# Patient Record
Sex: Female | Born: 1980 | Race: White | Hispanic: No | Smoking: Never smoker
Health system: Southern US, Community
[De-identification: ages and names within clinical notes are randomized; demographics above are authoritative.]

## PROBLEM LIST (undated history)

## (undated) DIAGNOSIS — D649 Anemia, unspecified: Secondary | ICD-10-CM

## (undated) DIAGNOSIS — M419 Scoliosis, unspecified: Secondary | ICD-10-CM

## (undated) HISTORY — DX: Anemia, unspecified: D64.9

## (undated) HISTORY — PX: NO PAST SURGERIES: SHX2092

## (undated) HISTORY — DX: Scoliosis, unspecified: M41.9

---

## 2020-09-05 NOTE — L&D Delivery Note (Signed)
OB/GYN Faculty Practice Delivery Note  Nicole Pierce is a 40 y.o. G6P5005 at [redacted]w[redacted]d admitted for SOL.   GBS Status: Negative  Maximum Maternal Temperature: 98.5  Labor course: Initial SVE: 4 cm. Augmentation with: AROM and Pitocin. She then progressed to complete.  ROM: 9h 67m with clear fluid  Birth: At 1157 a viable female was delivered via spontaneous vaginal delivery (Presentation: OA). Nuchal cord present: Yes.  Shoulders and body delivered in usual fashion. Infant placed directly on mom's abdomen for bonding/skin-to-skin, baby dried and stimulated. Cord clamped x 2 after 1 minute and cut by Spouse/FOB, Hector.  Cord blood collected by RN.  The placenta separated spontaneously and delivered via gentle cord traction.  Pitocin infused rapidly IV per protocol.  Fundus firm with massage.  Placenta inspected and appears to be intact with a 3 VC.  Placenta/Cord with the following complications: none.  Cord pH: n/a Sponge and instrument count were correct x2.  Intrapartum complications:  None Anesthesia:  epidural Episiotomy: none Lacerations:  none Suture Repair:  n/a EBL (mL): 300   Infant: APGAR (1 MIN):  9 APGAR (5 MINS):  9 Infant weight: 3600 gm (7 lbs 15 oz)  Mom to postpartum.  Baby to Couplet care / Skin to Skin. Placenta to L&D   Plans to Breastfeed Contraception: tubal ligation Circumcision: N/A  Note sent to [facility]: KV for pp visit.  Raelyn Mora , MSN, CNM 08/31/2021 12:08 PM

## 2021-07-05 ENCOUNTER — Encounter: Payer: Self-pay | Admitting: *Deleted

## 2021-07-05 DIAGNOSIS — O0930 Supervision of pregnancy with insufficient antenatal care, unspecified trimester: Secondary | ICD-10-CM | POA: Insufficient documentation

## 2021-07-06 ENCOUNTER — Other Ambulatory Visit (HOSPITAL_COMMUNITY)
Admission: RE | Admit: 2021-07-06 | Discharge: 2021-07-06 | Disposition: A | Discharge status: Home / Self Care | Payer: Medicaid Other | Source: Ambulatory Visit | Attending: Advanced Practice Midwife | Admitting: Advanced Practice Midwife

## 2021-07-06 ENCOUNTER — Encounter: Payer: Self-pay | Admitting: Advanced Practice Midwife

## 2021-07-06 ENCOUNTER — Other Ambulatory Visit: Payer: Self-pay

## 2021-07-06 ENCOUNTER — Telehealth: Payer: Self-pay | Admitting: *Deleted

## 2021-07-06 ENCOUNTER — Ambulatory Visit (INDEPENDENT_AMBULATORY_CARE_PROVIDER_SITE_OTHER): Payer: Medicaid Other | Admitting: Advanced Practice Midwife

## 2021-07-06 VITALS — BP 117/71 | HR 91 | Ht 64.0 in | Wt 196.0 lb

## 2021-07-06 DIAGNOSIS — O09523 Supervision of elderly multigravida, third trimester: Secondary | ICD-10-CM | POA: Insufficient documentation

## 2021-07-06 DIAGNOSIS — O0930 Supervision of pregnancy with insufficient antenatal care, unspecified trimester: Secondary | ICD-10-CM | POA: Diagnosis present

## 2021-07-06 DIAGNOSIS — Z3A31 31 weeks gestation of pregnancy: Secondary | ICD-10-CM

## 2021-07-06 DIAGNOSIS — O0933 Supervision of pregnancy with insufficient antenatal care, third trimester: Secondary | ICD-10-CM

## 2021-07-06 DIAGNOSIS — Z3493 Encounter for supervision of normal pregnancy, unspecified, third trimester: Secondary | ICD-10-CM

## 2021-07-06 LAB — OB RESULTS CONSOLE GC/CHLAMYDIA
Gonorrhea: NEGATIVE
Gonorrhea: NEGATIVE

## 2021-07-06 NOTE — Progress Notes (Signed)
Subjective:   Sheron Tallman is a 40 y.o. G6P5005 at [redacted]w[redacted]d by LMP being seen today for her first obstetrical visit.  Her obstetrical history is significant for  vaginal delivery x5   and has Late prenatal care affecting pregnancy on their problem list.. Patient does intend to breast feed. Pregnancy history fully reviewed.  Patient reports no complaints.  HISTORY: OB History  Gravida Para Term Preterm AB Living  6 5 5  0 0 5  SAB IAB Ectopic Multiple Live Births  0 0 0 0 0    # Outcome Date GA Lbr Len/2nd Weight Sex Delivery Anes PTL Lv  6 Current           5 Term      Vag-Spont     4 Term      Vag-Spont     3 Term      Vag-Spont     2 Term      Vag-Spont     1 Term      Vag-Spont      Past Medical History:  Diagnosis Date   Anemia    Scoliosis    History reviewed. No pertinent surgical history. History reviewed. No pertinent family history. Social History   Tobacco Use   Smoking status: Never   Smokeless tobacco: Never  Vaping Use   Vaping Use: Never used  Substance Use Topics   Alcohol use: Not Currently   Drug use: Never   Not on File Current Outpatient Medications on File Prior to Visit  Medication Sig Dispense Refill   ferrous sulfate 325 (65 FE) MG EC tablet Take 325 mg by mouth 3 (three) times daily with meals.     Prenatal Vit-Fe Fumarate-FA (MULTIVITAMIN-PRENATAL) 27-0.8 MG TABS tablet Take 1 tablet by mouth daily at 12 noon.     No current facility-administered medications on file prior to visit.    Exam   Vitals:   07/06/21 1340 07/06/21 1342  BP: 117/71   Pulse: 91   Weight: 196 lb (88.9 kg)   Height:  5\' 4"  (1.626 m)   Fetal Heart Rate (bpm): 143  VS reviewed, nursing note reviewed,  Constitutional: well developed, well nourished, no distress HEENT: normocephalic CV: normal rate Pulm/chest wall: normal effort Abdomen: soft Neuro: alert and oriented x 3 Skin: warm, dry Psych: affect normal     Assessment:   Pregnancy:  13/01/22 Patient Active Problem List   Diagnosis Date Noted   Late prenatal care affecting pregnancy 07/05/2021     Plan:  1. Late prenatal care affecting pregnancy, antepartum --Pt unsure of pregnancy early on then had death in the family and applied for insurance late. Pt with hx uncomplicated pregnancies and reports no problems in this pregnancy. - Culture, OB Urine - [id number] MFM OB DETAIL +14 WK; Future - GC/Chlamydia probe amp ([place])not at [facility]  2. [redacted] weeks gestation of pregnancy   3. Encounter for supervision of low-risk pregnancy in third trimester --Anticipatory guidance about next visits/weeks of pregnancy given. --Needs 2 hour GTT and labs, then prenatal visit in 2 weeks     Initial labs deferred until GTT scheduled Discussed and offered genetic screening options, including Quad screen/AFP, NIPS testing, and option to decline testing. Benefits/risks/alternatives reviewed. Pt aware that anatomy US is form of genetic screening with lower accuracy in detecting trisomies than blood work.  Pt chooses genetic screening today. NIPS: ordered. Ultrasound discussed; fetal anatomic survey: ordered. Problem list reviewed and updated. The nature  of [facility] Faculty Practice with multiple MDs and other Advanced Practice Providers was explained to patient; also emphasized that residents, students are part of our team. Routine obstetric precautions reviewed. Return in about 2 weeks (around 07/20/2021).   Sharen Counter, CNM 07/06/21 2:37 PM

## 2021-07-06 NOTE — Telephone Encounter (Signed)
Left patient an urgent message with MFM appointment information.

## 2021-07-06 NOTE — Progress Notes (Signed)
No prenatal care for this pregnancy yet Pt states last pap 2021- negative

## 2021-07-07 LAB — GC/CHLAMYDIA PROBE AMP (~~LOC~~) NOT AT ARMC
Chlamydia: NEGATIVE
Comment: NEGATIVE
Comment: NORMAL
Neisseria Gonorrhea: NEGATIVE

## 2021-07-08 LAB — CULTURE, OB URINE

## 2021-07-08 LAB — URINE CULTURE, OB REFLEX

## 2021-07-09 ENCOUNTER — Encounter: Payer: Self-pay | Admitting: *Deleted

## 2021-07-09 ENCOUNTER — Other Ambulatory Visit: Payer: Self-pay

## 2021-07-09 ENCOUNTER — Ambulatory Visit: Payer: Medicaid Other | Attending: Advanced Practice Midwife

## 2021-07-09 ENCOUNTER — Other Ambulatory Visit: Payer: Self-pay | Admitting: *Deleted

## 2021-07-09 ENCOUNTER — Ambulatory Visit: Payer: Medicaid Other | Admitting: *Deleted

## 2021-07-09 VITALS — BP 122/70 | HR 89

## 2021-07-09 DIAGNOSIS — O0930 Supervision of pregnancy with insufficient antenatal care, unspecified trimester: Secondary | ICD-10-CM

## 2021-07-09 DIAGNOSIS — O0933 Supervision of pregnancy with insufficient antenatal care, third trimester: Secondary | ICD-10-CM | POA: Diagnosis present

## 2021-07-09 DIAGNOSIS — Z362 Encounter for other antenatal screening follow-up: Secondary | ICD-10-CM

## 2021-07-09 DIAGNOSIS — Z3493 Encounter for supervision of normal pregnancy, unspecified, third trimester: Secondary | ICD-10-CM | POA: Insufficient documentation

## 2021-07-12 ENCOUNTER — Other Ambulatory Visit: Payer: Self-pay

## 2021-07-12 ENCOUNTER — Other Ambulatory Visit (INDEPENDENT_AMBULATORY_CARE_PROVIDER_SITE_OTHER): Payer: Medicaid Other

## 2021-07-12 DIAGNOSIS — Z23 Encounter for immunization: Secondary | ICD-10-CM

## 2021-07-12 DIAGNOSIS — Z3493 Encounter for supervision of normal pregnancy, unspecified, third trimester: Secondary | ICD-10-CM

## 2021-07-12 DIAGNOSIS — Z3A32 32 weeks gestation of pregnancy: Secondary | ICD-10-CM

## 2021-07-12 DIAGNOSIS — Z3483 Encounter for supervision of other normal pregnancy, third trimester: Secondary | ICD-10-CM

## 2021-07-13 LAB — HEMOGLOBIN A1C
Hgb A1c MFr Bld: 4.9 % of total Hgb (ref <5.7)
Mean Plasma Glucose: 94 mg/dL
eAG (mmol/L): 5.2 mmol/L

## 2021-07-13 LAB — OBSTETRIC PANEL
Absolute Monocytes: 485 cells/uL (ref 200–950)
Antibody Screen: NOT DETECTED
Basophils Absolute: 20 cells/uL (ref 0–200)
Basophils Relative: 0.2 %
Eosinophils Absolute: 109 cells/uL (ref 15–500)
Eosinophils Relative: 1.1 %
HCT: 32.6 % — ABNORMAL LOW (ref 35.0–45.0)
Hemoglobin: 11.1 g/dL — ABNORMAL LOW (ref 11.7–15.5)
Hepatitis B Surface Ag: NONREACTIVE
Lymphs Abs: 2515 cells/uL (ref 850–3900)
MCH: 29.8 pg (ref 27.0–33.0)
MCHC: 34 g/dL (ref 32.0–36.0)
MCV: 87.6 fL (ref 80.0–100.0)
MPV: 10.7 fL (ref 7.5–12.5)
Monocytes Relative: 4.9 %
Neutro Abs: 6772 cells/uL (ref 1500–7800)
Neutrophils Relative %: 68.4 %
Platelets: 200 10*3/uL (ref 140–400)
RBC: 3.72 10*6/uL — ABNORMAL LOW (ref 3.80–5.10)
RDW: 12.9 % (ref 11.0–15.0)
RPR Ser Ql: NONREACTIVE
Rubella: 2.52 Index
Total Lymphocyte: 25.4 %
WBC: 9.9 10*3/uL (ref 3.8–10.8)

## 2021-07-13 LAB — 2HR GTT W 1 HR, CARPENTER, 75 G
Glucose, 1 Hr, Gest: 142 mg/dL (ref 65–179)
Glucose, 2 Hr, Gest: 93 mg/dL (ref 65–152)
Glucose, Fasting, Gest: 86 mg/dL (ref 65–91)

## 2021-07-13 LAB — HEPATITIS C ANTIBODY
Hepatitis C Ab: NONREACTIVE
SIGNAL TO CUT-OFF: 0.11 (ref <1.00)

## 2021-07-13 LAB — HIV ANTIBODY (ROUTINE TESTING W REFLEX): HIV 1&2 Ab, 4th Generation: NONREACTIVE

## 2021-07-14 DIAGNOSIS — Z3493 Encounter for supervision of normal pregnancy, unspecified, third trimester: Secondary | ICD-10-CM

## 2021-07-20 ENCOUNTER — Other Ambulatory Visit: Payer: Self-pay

## 2021-07-20 ENCOUNTER — Ambulatory Visit (INDEPENDENT_AMBULATORY_CARE_PROVIDER_SITE_OTHER): Payer: Medicaid Other | Admitting: Advanced Practice Midwife

## 2021-07-20 VITALS — BP 122/68 | HR 99 | Wt 201.0 lb

## 2021-07-20 DIAGNOSIS — O09523 Supervision of elderly multigravida, third trimester: Secondary | ICD-10-CM

## 2021-07-20 DIAGNOSIS — Z3493 Encounter for supervision of normal pregnancy, unspecified, third trimester: Secondary | ICD-10-CM

## 2021-07-20 DIAGNOSIS — Z23 Encounter for immunization: Secondary | ICD-10-CM

## 2021-07-20 DIAGNOSIS — Z3A33 33 weeks gestation of pregnancy: Secondary | ICD-10-CM

## 2021-07-20 NOTE — Progress Notes (Signed)
   PRENATAL VISIT NOTE  Subjective:  Nicole Pierce is a 40 y.o. G6P5005 at [redacted]w[redacted]d being seen today for ongoing prenatal care.  She is currently monitored for the following issues for this low-risk pregnancy and has Late prenatal care affecting pregnancy; Encounter for supervision of low-risk pregnancy in third trimester; and Multigravida of advanced maternal age in third trimester on their problem list.  Patient reports no complaints.  Contractions: Irritability. Vag. Bleeding: None.  Movement: Present. Denies leaking of fluid.   The following portions of the patient's history were reviewed and updated as appropriate: allergies, current medications, past family history, past medical history, past social history, past surgical history and problem list.   Objective:   Vitals:   07/20/21 1351  BP: 122/68  Pulse: 99  Weight: 201 lb (91.2 kg)    Fetal Status: Fetal Heart Rate (bpm): 145   Movement: Present     General:  Alert, oriented and cooperative. Patient is in no acute distress.  Skin: Skin is warm and dry. No rash noted.   Cardiovascular: Normal heart rate noted  Respiratory: Normal respiratory effort, no problems with respiration noted  Abdomen: Soft, gravid, appropriate for gestational age.  Pain/Pressure: Absent     Pelvic: Cervical exam deferred        Extremities: Normal range of motion.  Edema: None  Mental Status: Normal mood and affect. Normal behavior. Normal judgment and thought content.   Assessment and Plan:  Pregnancy: G6P5005 at [redacted]w[redacted]d 1. Encounter for supervision of low-risk pregnancy in third trimester --Anticipatory guidance about next visits/weeks of pregnancy given. --Reviewed anatomy US with EFW 68% --Next visit in 2 weeks   2. [redacted] weeks gestation of pregnancy   3. Multigravida of advanced maternal age in third trimester   Preterm labor symptoms and general obstetric precautions including but not limited to vaginal bleeding, contractions, leaking of  fluid and fetal movement were reviewed in detail with the patient. Please refer to After Visit Summary for other counseling recommendations.   No follow-ups on file.  Future Appointments  Date Time Provider Department Center  08/05/2021  8:30 AM [facility] NURSE [facility]  08/05/2021  8:45 AM [facility] US5 [facility]    Sharen Counter, CNM

## 2021-08-03 ENCOUNTER — Ambulatory Visit (INDEPENDENT_AMBULATORY_CARE_PROVIDER_SITE_OTHER): Payer: Medicaid Other | Admitting: Advanced Practice Midwife

## 2021-08-03 ENCOUNTER — Other Ambulatory Visit: Payer: Self-pay

## 2021-08-03 VITALS — BP 115/75 | HR 99 | Wt 200.0 lb

## 2021-08-03 DIAGNOSIS — Z3A35 35 weeks gestation of pregnancy: Secondary | ICD-10-CM

## 2021-08-03 DIAGNOSIS — Z3493 Encounter for supervision of normal pregnancy, unspecified, third trimester: Secondary | ICD-10-CM

## 2021-08-03 NOTE — Patient Instructions (Signed)
Things to Try After 37 weeks to Encourage Labor/Get Ready for Labor:    Try the Miles Circuit at www.milescircuit.com daily to improve baby's position and encourage the onset of labor.  Walk a little and rest a little every day.  Change positions often.  Cervical Ripening: May try one or both Red Raspberry Leaf capsules or tea:  two 300mg or 400mg tablets with each meal, 2-3 times a day, or 1-3 cups of tea daily  Potential Side Effects Of Raspberry Leaf:  Most women do not experience any side effects from drinking raspberry leaf tea. However, nausea and loose stools are possible   Evening Primrose Oil capsules: take 1 capsule by mouth and place one capsule in the vagina every night.    Some of the potential side effects:  Upset stomach  Loose stools or diarrhea  Headaches  Nausea  Sex can also help the cervix ripen and encourage labor onset.    Labor Precautions Reasons to come to MAU at [facility]:  1.  Contractions are  5 minutes apart or less, each last 1 minute, these have been going on for 1-2 hours, and you cannot walk or talk during them 2.  You have a large gush of fluid, or a trickle of fluid that will not stop and you have to wear a pad 3.  You have bleeding that is bright red, heavier than spotting--like menstrual bleeding (spotting can be normal in early labor or after a check of your cervix) 4.  You do not feel the baby moving like he/she normally does  

## 2021-08-03 NOTE — Progress Notes (Signed)
   PRENATAL VISIT NOTE  Subjective:  Nicole Pierce is a 40 y.o. G6P5005 at [redacted]w[redacted]d being seen today for ongoing prenatal care.  She is currently monitored for the following issues for this low-risk pregnancy and has Late prenatal care affecting pregnancy; Encounter for supervision of low-risk pregnancy in third trimester; and Multigravida of advanced maternal age in third trimester on their problem list.  Patient reports no complaints.  Contractions: Not present. Vag. Bleeding: None.  Movement: Present. Denies leaking of fluid.   The following portions of the patient's history were reviewed and updated as appropriate: allergies, current medications, past family history, past medical history, past social history, past surgical history and problem list.   Objective:   Vitals:   08/03/21 1428  BP: 115/75  Pulse: 99  Weight: 200 lb (90.7 kg)    Fetal Status: Fetal Heart Rate (bpm): 143   Movement: Present     General:  Alert, oriented and cooperative. Patient is in no acute distress.  Skin: Skin is warm and dry. No rash noted.   Cardiovascular: Normal heart rate noted  Respiratory: Normal respiratory effort, no problems with respiration noted  Abdomen: Soft, gravid, appropriate for gestational age.  Pain/Pressure: Absent     Pelvic: Cervical exam deferred        Extremities: Normal range of motion.  Edema: None  Mental Status: Normal mood and affect. Normal behavior. Normal judgment and thought content.   Assessment and Plan:  Pregnancy: G6P5005 at [redacted]w[redacted]d 1. Encounter for supervision of low-risk pregnancy in third trimester --Anticipatory guidance about next visits/weeks of pregnancy given. --Next visit in 2 weeks for GBS/GCC  2. [redacted] weeks gestation of pregnancy   Preterm labor symptoms and general obstetric precautions including but not limited to vaginal bleeding, contractions, leaking of fluid and fetal movement were reviewed in detail with the patient. Please refer to After  Visit Summary for other counseling recommendations.   Return in about 2 weeks (around 08/17/2021).  Future Appointments  Date Time Provider Department Center  08/05/2021  8:30 AM [facility] NURSE [facility]  08/05/2021  8:45 AM [facility] US5 [facility]  08/19/2021  8:50 AM Milas Hock, MD [facility]    Sharen Counter, CNM

## 2021-08-05 ENCOUNTER — Encounter: Payer: Self-pay | Admitting: *Deleted

## 2021-08-05 ENCOUNTER — Ambulatory Visit: Payer: Medicaid Other | Admitting: *Deleted

## 2021-08-05 ENCOUNTER — Other Ambulatory Visit: Payer: Self-pay

## 2021-08-05 ENCOUNTER — Ambulatory Visit: Payer: Medicaid Other | Attending: Maternal & Fetal Medicine

## 2021-08-05 VITALS — BP 123/75 | HR 78

## 2021-08-05 DIAGNOSIS — O0933 Supervision of pregnancy with insufficient antenatal care, third trimester: Secondary | ICD-10-CM | POA: Insufficient documentation

## 2021-08-05 DIAGNOSIS — O0943 Supervision of pregnancy with grand multiparity, third trimester: Secondary | ICD-10-CM | POA: Diagnosis not present

## 2021-08-05 DIAGNOSIS — O09523 Supervision of elderly multigravida, third trimester: Secondary | ICD-10-CM

## 2021-08-05 DIAGNOSIS — Z3A35 35 weeks gestation of pregnancy: Secondary | ICD-10-CM | POA: Diagnosis not present

## 2021-08-05 DIAGNOSIS — Z362 Encounter for other antenatal screening follow-up: Secondary | ICD-10-CM | POA: Insufficient documentation

## 2021-08-19 ENCOUNTER — Encounter: Payer: Medicaid Other | Admitting: Obstetrics and Gynecology

## 2021-08-23 ENCOUNTER — Other Ambulatory Visit (HOSPITAL_COMMUNITY)
Admission: RE | Admit: 2021-08-23 | Discharge: 2021-08-23 | Disposition: A | Discharge status: Home / Self Care | Payer: Medicaid Other | Source: Ambulatory Visit | Attending: Obstetrics & Gynecology | Admitting: Obstetrics & Gynecology

## 2021-08-23 ENCOUNTER — Ambulatory Visit (INDEPENDENT_AMBULATORY_CARE_PROVIDER_SITE_OTHER): Payer: Medicaid Other | Admitting: Obstetrics & Gynecology

## 2021-08-23 ENCOUNTER — Other Ambulatory Visit: Payer: Self-pay

## 2021-08-23 VITALS — BP 117/77 | HR 93 | Wt 202.0 lb

## 2021-08-23 DIAGNOSIS — Z3493 Encounter for supervision of normal pregnancy, unspecified, third trimester: Secondary | ICD-10-CM

## 2021-08-23 LAB — OB RESULTS CONSOLE GC/CHLAMYDIA: Gonorrhea: NEGATIVE

## 2021-08-23 NOTE — Progress Notes (Signed)
° °  PRENATAL VISIT NOTE  Subjective:  Nicole Pierce is a 40 y.o. G6P5005 at [redacted]w[redacted]d being seen today for ongoing prenatal care.  She is currently monitored for the following issues for this low-risk pregnancy and has Late prenatal care affecting pregnancy; Encounter for supervision of low-risk pregnancy in third trimester; and Multigravida of advanced maternal age in third trimester on their problem list.  Patient reports  occasional contractions .   .  .   . Denies leaking of fluid.   The following portions of the patient's history were reviewed and updated as appropriate: allergies, current medications, past family history, past medical history, past social history, past surgical history and problem list.   Objective:  There were no vitals filed for this visit.  Fetal Status:           General:  Alert, oriented and cooperative. Patient is in no acute distress.  Skin: Skin is warm and dry. No rash noted.   Cardiovascular: Normal heart rate noted  Respiratory: Normal respiratory effort, no problems with respiration noted  Abdomen: Soft, gravid, appropriate for gestational age.        Pelvic: Cervical exam performed in the presence of a chaperone   loose 2/50/-2 anterior     Extremities: Normal range of motion.     Mental Status: Normal mood and affect. Normal behavior. Normal judgment and thought content.   Assessment and Plan:  Pregnancy: G6P5005 at [redacted]w[redacted]d   Induce at 40 weeks if no labor Cultures today Vertex, fundal height 38  Term labor symptoms and general obstetric precautions including but not limited to vaginal bleeding, contractions, leaking of fluid and fetal movement were reviewed in detail with the patient. Please refer to After Visit Summary for other counseling recommendations.   No follow-ups on file.  No future appointments.  Elsie Lincoln, MD

## 2021-08-25 LAB — CERVICOVAGINAL ANCILLARY ONLY
Chlamydia: NEGATIVE
Comment: NEGATIVE
Comment: NORMAL
Neisseria Gonorrhea: NEGATIVE

## 2021-08-26 LAB — CULTURE, BETA STREP (GROUP B ONLY)
MICRO NUMBER:: 12776780
SPECIMEN QUALITY:: ADEQUATE

## 2021-08-30 ENCOUNTER — Other Ambulatory Visit: Payer: Self-pay

## 2021-08-30 ENCOUNTER — Inpatient Hospital Stay (HOSPITAL_COMMUNITY)
Admission: EM | Admit: 2021-08-30 | Discharge: 2021-09-01 | DRG: 807 | Disposition: A | Discharge status: Home / Self Care | Payer: Medicaid Other | Attending: Family Medicine | Admitting: Family Medicine

## 2021-08-30 ENCOUNTER — Encounter (HOSPITAL_COMMUNITY): Payer: Self-pay | Admitting: Obstetrics & Gynecology

## 2021-08-30 DIAGNOSIS — R030 Elevated blood-pressure reading, without diagnosis of hypertension: Secondary | ICD-10-CM | POA: Diagnosis present

## 2021-08-30 DIAGNOSIS — O479 False labor, unspecified: Secondary | ICD-10-CM

## 2021-08-30 DIAGNOSIS — O0930 Supervision of pregnancy with insufficient antenatal care, unspecified trimester: Secondary | ICD-10-CM

## 2021-08-30 DIAGNOSIS — Z20822 Contact with and (suspected) exposure to covid-19: Secondary | ICD-10-CM | POA: Diagnosis present

## 2021-08-30 DIAGNOSIS — O26893 Other specified pregnancy related conditions, third trimester: Secondary | ICD-10-CM | POA: Diagnosis present

## 2021-08-30 DIAGNOSIS — Z3493 Encounter for supervision of normal pregnancy, unspecified, third trimester: Secondary | ICD-10-CM

## 2021-08-30 DIAGNOSIS — Z3A39 39 weeks gestation of pregnancy: Secondary | ICD-10-CM | POA: Diagnosis not present

## 2021-08-30 NOTE — MAU Note (Signed)
Pt reports contractions q 10-15 minutes, gush of fluid around 1930

## 2021-08-30 NOTE — H&P (Signed)
Nicole Pierce is a 40 y.o. female [phone] with IUP at [redacted]w[redacted]d by LMP/3rd trimester [place] presenting for ? ROM/contractions. She reports positive fetal movement. She denies vaginal bleeding.  Prenatal History: Term SVD X5 PNC at [facility] Pregnancy complications:  Late PNC @ 31 weeks    Past Medical History: Past Medical History:  Diagnosis Date   Anemia    Scoliosis     Past Surgical History: Past Surgical History:  Procedure Laterality Date   NO PAST SURGERIES      Obstetrical History: OB History     Gravida  6   Para  5   Term  5   Preterm      AB      Living  5      SAB      IAB      Ectopic      Multiple      Live Births               Social History: Social History   Socioeconomic History   Marital status: Significant Other    Spouse name: Not on file   Number of children: Not on file   Years of education: Not on file   Highest education level: Not on file  Occupational History   Not on file  Tobacco Use   Smoking status: Never   Smokeless tobacco: Never  Vaping Use   Vaping Use: Never used  Substance and Sexual Activity   Alcohol use: Not Currently   Drug use: Never   Sexual activity: Yes    Birth control/protection: None  Other Topics Concern   Not on file  Social History Narrative   Not on file   Social Determinants of Health   Financial Resource Strain: Not on file  Food Insecurity: Not on file  Transportation Needs: Not on file  Physical Activity: Not on file  Stress: Not on file  Social Connections: Not on file    Family History: History reviewed. No pertinent family history.  Allergies: No Known Allergies  Medications Prior to Admission  Medication Sig Dispense Refill Last Dose   ferrous sulfate 325 (65 FE) MG EC tablet Take 325 mg by mouth 3 (three) times daily with meals.   08/30/2021   Prenatal Vit-Fe Fumarate-FA (MULTIVITAMIN-PRENATAL) 27-0.8 MG TABS tablet Take 1 tablet by mouth daily at 12 noon.    08/30/2021    Review of Systems   Constitutional: Negative for fever and chills Eyes: Negative for visual disturbances Respiratory: Negative for shortness of breath, dyspnea Cardiovascular: Negative for chest pain or palpitations  Gastrointestinal: Negative for vomiting, diarrhea and constipation.  POSITIVE for abdominal pain (contractions) Genitourinary: Negative for dysuria and urgency Musculoskeletal: Negative for back pain, joint pain, myalgias  Neurological: Negative for dizziness and headaches  Blood pressure 136/80, pulse 84, temperature 98 F (36.7 C), resp. rate 18, last menstrual period 11/27/2020, SpO2 98 %. General appearance: alert, cooperative, and no distress Lungs: normal respiratory effort Heart: regular rate and rhythm Abdomen: soft, non-tender; bowel sounds normal Extremities: Homans sign is negative, no sign of DVT DTR's 2+ Presentation: cephalic Fetal monitoring  Baseline: 140 bpm, Variability: Good {> 6 bpm), Accelerations: Reactive, and Decelerations: Absent Uterine activity  2-3 minutes Dilation: 7 Effacement (%): 80 Station: -1 Exam by:: Nicole Smart RN   Prenatal labs: ABO, Rh: B/RH(D) POSITIVE/-- (11/07 0858) Antibody: NO ANTIBODIES DETECTED (11/07 0858) Rubella: 2.52 (11/07 0858) RPR: NON-REACTIVE (11/07 0858)  HBsAg: NON-REACTIVE (11/07 0858)  HIV:  NON-REACTIVE (11/07 9937)   Nursing Staff Provider  Office Location  [place] Dating    Language  English Anatomy US  wnl  Flu Vaccine  07/20/21 Genetic/Carrier Screen  NIPS: Low Risk AFP:    [place]:  TDaP Vaccine   07/12/21 Hgb A1C or  GTT Early  Third trimester normal  COVID Vaccine    LAB RESULTS   Rhogam  NA Blood Type   B positive  Baby Feeding Plan Breast Antibody  neg  Contraception BTL Rubella  immune  Circumcision NA RPR   nonreactive  Pediatrician  [facility] HBsAg   neg  Support Person Hector HCVAb neg  Prenatal Classes  HIV     nonreactive  BTL  Consent 07/06/21 GBS  negative (For PCN allergy, check sensitivities)   VBAC Consent  Pap         DME Rx [ ]  BP cuff [ ]  Weight Scale Waterbirth  [ ]  Class [ ]  Consent [ ]  CNM visit  PHQ9 & GAD7 [x  ] new OB [ x ] 28 weeks  [  ] 36 weeks Induction  [ ]  Orders Entered [ ] Foley Y/N   Prenatal Transfer Tool  Maternal Diabetes: No Genetic Screening: Declined Maternal Ultrasounds/Referrals: Normal Fetal Ultrasounds or other Referrals:  None Maternal Substance Abuse:  No Significant Maternal Medications:  None Significant Maternal Lab Results: Group B Strep negative  No results found for this or any previous visit (from the past 24 hour(s)).  Assessment: Nicole Pierce is a 40 y.o. with an IUP at [redacted]w[redacted]d presenting for active labor  Plan: #Labor: expectant management #Pain:  Per request #FWB Cat 1   08/30/2021, 11:56 PM

## 2021-08-30 NOTE — ED Triage Notes (Signed)
Pt brought for imminent delivery. Pt is [redacted] weeks pregnant and contractions are 15 minutes apart.

## 2021-08-30 NOTE — ED Provider Notes (Signed)
Emergency Medicine Provider OB Triage Evaluation Note  Nicole Pierce is a 40 y.o. female, [id number], at [redacted]w[redacted]d gestation who presents to the emergency department with complaints of water breaking just PTA and contractions since yesterday. Contractions currently every 15 minutes, does not feel the urge to push at this time. Uncomplicated pregnancy thus far, all prior vaginal deliveries.   Review of  Systems  Positive: contractions, water breaking Negative: syncope  Physical Exam  BP (!) 144/95 (BP Location: Right Arm)    Pulse 100    Temp 98 F (36.7 C)    Resp 17    LMP 11/27/2020    SpO2 98%  General: Awake, alert HEENT: Atraumatic  Resp: Normal effort  Cardiac: Normal rate Abd: gravid  MSK: Moves all extremities without difficulty Neuro: Speech clear  Medical Decision Making  Pt evaluated for pregnancy concern and is stable for transfer to MAU. Pt is in agreement with plan for transfer.  10:27 PM Discussed with MAU APP who accepts patient in transfer.  Clinical Impression   1. Uterine contractions        Cherly Anderson, PA-C 08/30/21 2245    Derwood Kaplan, MD 08/30/21 2317

## 2021-08-31 ENCOUNTER — Inpatient Hospital Stay (HOSPITAL_COMMUNITY): Payer: Medicaid Other | Admitting: Anesthesiology

## 2021-08-31 ENCOUNTER — Encounter (HOSPITAL_COMMUNITY): Payer: Self-pay | Admitting: Obstetrics & Gynecology

## 2021-08-31 ENCOUNTER — Other Ambulatory Visit: Payer: Self-pay

## 2021-08-31 ENCOUNTER — Encounter: Payer: Medicaid Other | Admitting: Advanced Practice Midwife

## 2021-08-31 DIAGNOSIS — Z3A39 39 weeks gestation of pregnancy: Secondary | ICD-10-CM

## 2021-08-31 LAB — RESP PANEL BY RT-PCR (FLU A&B, COVID) ARPGX2
Influenza A by PCR: NEGATIVE
Influenza B by PCR: NEGATIVE
SARS Coronavirus 2 by RT PCR: NEGATIVE

## 2021-08-31 LAB — CBC
HCT: 35 % — ABNORMAL LOW (ref 36.0–46.0)
Hemoglobin: 12.2 g/dL (ref 12.0–15.0)
MCH: 30.6 pg (ref 26.0–34.0)
MCHC: 34.9 g/dL (ref 30.0–36.0)
MCV: 87.7 fL (ref 80.0–100.0)
Platelets: 189 10*3/uL (ref 150–400)
RBC: 3.99 MIL/uL (ref 3.87–5.11)
RDW: 14.3 % (ref 11.5–15.5)
WBC: 10.3 10*3/uL (ref 4.0–10.5)
nRBC: 0 % (ref 0.0–0.2)

## 2021-08-31 LAB — RPR: RPR Ser Ql: NONREACTIVE

## 2021-08-31 LAB — TYPE AND SCREEN
ABO/RH(D): B POS
Antibody Screen: NEGATIVE

## 2021-08-31 MED ORDER — PHENYLEPHRINE 40 MCG/ML (10ML) SYRINGE FOR IV PUSH (FOR BLOOD PRESSURE SUPPORT): 80.0000 ug | PREFILLED_SYRINGE | INTRAVENOUS | Status: DC | PRN

## 2021-08-31 MED ORDER — OXYCODONE HCL 5 MG PO TABS: 5.0000 mg | ORAL_TABLET | ORAL | Status: DC | PRN

## 2021-08-31 MED ORDER — OXYCODONE-ACETAMINOPHEN 5-325 MG PO TABS: 2.0000 | ORAL_TABLET | ORAL | Status: DC | PRN

## 2021-08-31 MED ORDER — COCONUT OIL OIL: 1.0000 "application " | TOPICAL_OIL | Status: DC | PRN

## 2021-08-31 MED ORDER — SENNOSIDES-DOCUSATE SODIUM 8.6-50 MG PO TABS
2.0000 | ORAL_TABLET | Freq: Every day | ORAL | Status: DC
  Administered 2021-09-01: 12:00:00 2 via ORAL
  Filled 2021-08-31: qty 2

## 2021-08-31 MED ORDER — WITCH HAZEL-GLYCERIN EX PADS: 1.0000 "application " | MEDICATED_PAD | CUTANEOUS | Status: DC | PRN

## 2021-08-31 MED ORDER — ONDANSETRON HCL 4 MG PO TABS: 4.0000 mg | ORAL_TABLET | ORAL | Status: DC | PRN

## 2021-08-31 MED ORDER — OXYTOCIN-SODIUM CHLORIDE 30-0.9 UT/500ML-% IV SOLN
1.0000 m[IU]/min | INTRAVENOUS | Status: DC
  Administered 2021-08-31: 08:00:00 2 m[IU]/min via INTRAVENOUS

## 2021-08-31 MED ORDER — OXYCODONE-ACETAMINOPHEN 5-325 MG PO TABS: 1.0000 | ORAL_TABLET | ORAL | Status: DC | PRN

## 2021-08-31 MED ORDER — ZOLPIDEM TARTRATE 5 MG PO TABS: 5.0000 mg | ORAL_TABLET | Freq: Every evening | ORAL | Status: DC | PRN

## 2021-08-31 MED ORDER — LACTATED RINGERS IV SOLN: 500.0000 mL | Freq: Once | INTRAVENOUS | Status: DC

## 2021-08-31 MED ORDER — FENTANYL-BUPIVACAINE-NACL 0.5-0.125-0.9 MG/250ML-% EP SOLN
EPIDURAL | Status: AC
  Filled 2021-08-31: qty 250

## 2021-08-31 MED ORDER — ONDANSETRON HCL 4 MG/2ML IJ SOLN: 4.0000 mg | Freq: Four times a day (QID) | INTRAMUSCULAR | Status: DC | PRN

## 2021-08-31 MED ORDER — ACETAMINOPHEN 325 MG PO TABS: 650.0000 mg | ORAL_TABLET | ORAL | Status: DC | PRN

## 2021-08-31 MED ORDER — IBUPROFEN 600 MG PO TABS
600.0000 mg | ORAL_TABLET | Freq: Four times a day (QID) | ORAL | Status: DC
  Administered 2021-08-31 – 2021-09-01 (×4): 600 mg via ORAL
  Filled 2021-08-31 (×4): qty 1

## 2021-08-31 MED ORDER — OXYCODONE HCL 5 MG PO TABS: 10.0000 mg | ORAL_TABLET | ORAL | Status: DC | PRN

## 2021-08-31 MED ORDER — PRENATAL MULTIVITAMIN CH
1.0000 | ORAL_TABLET | Freq: Every day | ORAL | Status: DC
  Administered 2021-09-01: 12:00:00 1 via ORAL
  Filled 2021-08-31: qty 1

## 2021-08-31 MED ORDER — TERBUTALINE SULFATE 1 MG/ML IJ SOLN: 0.2500 mg | Freq: Once | INTRAMUSCULAR | Status: DC | PRN

## 2021-08-31 MED ORDER — DIBUCAINE (PERIANAL) 1 % EX OINT: 1.0000 "application " | TOPICAL_OINTMENT | CUTANEOUS | Status: DC | PRN

## 2021-08-31 MED ORDER — FLEET ENEMA 7-19 GM/118ML RE ENEM: 1.0000 | ENEMA | RECTAL | Status: DC | PRN

## 2021-08-31 MED ORDER — EPHEDRINE 5 MG/ML INJ: 10.0000 mg | INTRAVENOUS | Status: DC | PRN

## 2021-08-31 MED ORDER — SIMETHICONE 80 MG PO CHEW: 80.0000 mg | CHEWABLE_TABLET | ORAL | Status: DC | PRN

## 2021-08-31 MED ORDER — FERROUS SULFATE 325 (65 FE) MG PO TABS
325.0000 mg | ORAL_TABLET | ORAL | Status: DC
  Administered 2021-08-31: 17:00:00 325 mg via ORAL
  Filled 2021-08-31: qty 1

## 2021-08-31 MED ORDER — FENTANYL-BUPIVACAINE-NACL 0.5-0.125-0.9 MG/250ML-% EP SOLN
12.0000 mL/h | EPIDURAL | Status: DC | PRN
  Administered 2021-08-31: 01:00:00 12 mL/h via EPIDURAL
  Filled 2021-08-31: qty 250

## 2021-08-31 MED ORDER — ONDANSETRON HCL 4 MG/2ML IJ SOLN: 4.0000 mg | INTRAMUSCULAR | Status: DC | PRN

## 2021-08-31 MED ORDER — BENZOCAINE-MENTHOL 20-0.5 % EX AERO
1.0000 "application " | INHALATION_SPRAY | CUTANEOUS | Status: DC | PRN
  Filled 2021-08-31: qty 56

## 2021-08-31 MED ORDER — LACTATED RINGERS IV SOLN: 500.0000 mL | INTRAVENOUS | Status: DC | PRN

## 2021-08-31 MED ORDER — ACETAMINOPHEN 325 MG PO TABS
650.0000 mg | ORAL_TABLET | ORAL | Status: DC | PRN
  Administered 2021-09-01: 09:00:00 650 mg via ORAL
  Filled 2021-08-31: qty 2

## 2021-08-31 MED ORDER — HYDROXYZINE HCL 50 MG PO TABS: 50.0000 mg | ORAL_TABLET | Freq: Four times a day (QID) | ORAL | Status: DC | PRN

## 2021-08-31 MED ORDER — LIDOCAINE HCL (PF) 1 % IJ SOLN: 30.0000 mL | INTRAMUSCULAR | Status: DC | PRN

## 2021-08-31 MED ORDER — OXYTOCIN BOLUS FROM INFUSION: 333.0000 mL | Freq: Once | INTRAVENOUS | Status: DC

## 2021-08-31 MED ORDER — TETANUS-DIPHTH-ACELL PERTUSSIS 5-2.5-18.5 LF-MCG/0.5 IM SUSY: 0.5000 mL | PREFILLED_SYRINGE | Freq: Once | INTRAMUSCULAR | Status: DC

## 2021-08-31 MED ORDER — DIPHENHYDRAMINE HCL 25 MG PO CAPS: 25.0000 mg | ORAL_CAPSULE | Freq: Four times a day (QID) | ORAL | Status: DC | PRN

## 2021-08-31 MED ORDER — DIPHENHYDRAMINE HCL 50 MG/ML IJ SOLN: 12.5000 mg | INTRAMUSCULAR | Status: DC | PRN

## 2021-08-31 MED ORDER — PHENYLEPHRINE 40 MCG/ML (10ML) SYRINGE FOR IV PUSH (FOR BLOOD PRESSURE SUPPORT)
PREFILLED_SYRINGE | INTRAVENOUS | Status: AC
  Filled 2021-08-31: qty 10

## 2021-08-31 MED ORDER — LIDOCAINE HCL (PF) 1 % IJ SOLN
INTRAMUSCULAR | Status: DC | PRN
  Administered 2021-08-31: 11 mL via EPIDURAL

## 2021-08-31 MED ORDER — LACTATED RINGERS IV SOLN: INTRAVENOUS | Status: DC

## 2021-08-31 MED ORDER — FENTANYL CITRATE (PF) 100 MCG/2ML IJ SOLN: 50.0000 ug | INTRAMUSCULAR | Status: DC | PRN

## 2021-08-31 MED ORDER — OXYTOCIN-SODIUM CHLORIDE 30-0.9 UT/500ML-% IV SOLN
2.5000 [IU]/h | INTRAVENOUS | Status: DC
  Filled 2021-08-31: qty 500

## 2021-08-31 MED ORDER — SOD CITRATE-CITRIC ACID 500-334 MG/5ML PO SOLN: 30.0000 mL | ORAL | Status: DC | PRN

## 2021-08-31 NOTE — Progress Notes (Signed)
Continue to adjust monitors.

## 2021-08-31 NOTE — Progress Notes (Signed)
Patient Vitals for the past 4 hrs:  BP Temp Temp src Pulse Resp  08/31/21 0730 (!) 133/93 -- -- 90 --  08/31/21 0700 120/63 -- -- 84 18  08/31/21 0632 114/67 -- -- 93 16  08/31/21 0601 121/72 -- -- 78 18  08/31/21 0531 116/66 -- -- 78 16  08/31/21 0501 122/64 -- -- 85 18  08/31/21 0448 -- 98 F (36.7 C) Oral -- --  08/31/21 0431 132/78 -- -- 86 16  08/31/21 0401 (!) 131/97 -- -- (!) 156 18   Comfortable w/epidural.  FHR Cat 1.  Ctx q 3 minutes.  Cx 9/90/-1.  Baby ROP. Will start pitocin.

## 2021-08-31 NOTE — Lactation Note (Signed)
This note was copied from a baby's chart. Lactation Consultation Note  Patient Name: Nicole Pierce [id number] Date: 08/31/2021 Reason for consult: L&D Initial assessment;Term Age:39 hours  LC in to assist with first feeding.  Baby already latched in cradle hold.  LC assisted with a deeper latch to the breast and added pillow support under Mom's elbow.    Mom breastfed with supplementation all of her other 5 babies.  Mom never felt she had enough milk for babies.  Mom reports slight breast changes in early pregnancy.  Breasts are notably wide spaced.   Talked to Mom about pumping after breastfeeding using a DEBP once she moves to [place].  Mom is interested in efforts to support a fuller milk supply.    Mom also has WIC in [place].  Talked about faxing a request to [facility] for a hospital grade DEBP on discharge.  Encouraged keeping baby STS and offering the breast often with cues.  Maternal Data Has patient been taught Hand Expression?: Yes Does the patient have breastfeeding experience prior to this delivery?: Yes How long did the patient breastfeed?: 7 months  Feeding Mother's Current Feeding Choice: Breast Milk and Formula  LATCH Score Latch: Grasps breast easily, tongue down, lips flanged, rhythmical sucking.  Audible Swallowing: Spontaneous and intermittent  Type of Nipple: Everted at rest and after stimulation (large nipple length and diameter)  Comfort (Breast/Nipple): Soft / non-tender  Hold (Positioning): Assistance needed to correctly position infant at breast and maintain latch.  LATCH Score: 9  Interventions Interventions: Breast feeding basics reviewed;Assisted with latch;Skin to skin;Breast massage;Hand express;Support pillows;Adjust position  Discharge [facility] Program: Yes  Consult Status Consult Status: Follow-up from L&D Date: 08/31/21 Follow-up type: In-patient    [name] 08/31/2021, 12:41 PM

## 2021-08-31 NOTE — Progress Notes (Signed)
Comfortable w/epidural.  FHR Cat 1.  Ctx q 3 minutres . Cx 8/90/-2.  AROM w/llight mec fluid.  Anticipate delivery soon.

## 2021-08-31 NOTE — Discharge Summary (Signed)
Postpartum Discharge Summary      Patient Name: Nicole Pierce DOB: March 21, 1981 MRN: [id number]  Date of admission: 08/30/2021 Delivery date:08/31/2021  Delivering provider: Laury Deep  Date of discharge: 09/01/2021  Admitting diagnosis: Indication for care in labor or delivery [O75.9] Intrauterine pregnancy: [redacted]w[redacted]d     Secondary diagnosis:  Principal Problem:   Indication for care in labor or delivery Active Problems:   Late prenatal care affecting pregnancy   Encounter for supervision of low-risk pregnancy in third trimester  Additional problems: Elevated BP in labor, normal post partum    Discharge diagnosis: Term Pregnancy Delivered                                              Post partum procedures: None   Augmentation: AROM Complications: None  Hospital course: Onset of Labor With Vaginal Delivery      40 y.o. yo [id number] at [redacted]w[redacted]d was admitted in Active Labor on 08/30/2021. Patient had an uncomplicated labor course as follows:  Membrane Rupture Time/Date: 2:01 AM ,08/31/2021   Delivery Method:Vaginal, Spontaneous  Episiotomy: None  Lacerations:    Patient had an uncomplicated postpartum course.  She is ambulating, tolerating a regular diet, passing flatus, and urinating well. Patient is discharged home in stable condition on 09/01/21. She had elevated BP towards the end of her labor however her BP were normotensive in post partum period. She was discharged with plan for 1 week BP check  Newborn Data: Birth date:08/31/2021  Birth time:11:57 AM  Gender:Female  Living status:Living  Apgars:9 ,9  Weight:3600 g   Magnesium Sulfate received: No BMZ received: No Rhophylac:N/A MMR:N/A T-DaP:Given prenatally Flu: Yes Transfusion:No  Physical exam  Vitals:   08/31/21 1854 08/31/21 2200 09/01/21 0200 09/01/21 0600  BP: 117/89 122/75 126/76 119/87  Pulse: 83 79 80 71  Resp: $Remo'18 18 18 17  'tSzBz$ Temp: 98.4 F (36.9 C) 98 F (36.7 C) 98.1 F (36.7 C) 98.2 F  (36.8 C)  TempSrc: Oral Oral Oral Oral  SpO2: 98% 95% 98% 97%  Weight:      Height:       General: alert Lochia: appropriate Uterine Fundus: firm Incision: N/A DVT Evaluation: No evidence of DVT seen on physical exam. Labs: Lab Results  Component Value Date   WBC 10.3 08/30/2021   HGB 12.2 08/30/2021   HCT 35.0 (L) 08/30/2021   MCV 87.7 08/30/2021   PLT 189 08/30/2021   No flowsheet data found. Edinburgh Score: No flowsheet data found.   After visit meds:  Allergies as of 09/01/2021   No Known Allergies      Medication List     STOP taking these medications    calcium carbonate 500 MG chewable tablet Commonly known as: TUMS - dosed in mg elemental calcium   ferrous sulfate 325 (65 FE) MG EC tablet       TAKE these medications    acetaminophen 325 MG tablet Commonly known as: Tylenol Take 2 tablets (650 mg total) by mouth every 4 (four) hours as needed (for pain scale < 4).   ibuprofen 600 MG tablet Commonly known as: ADVIL Take 1 tablet (600 mg total) by mouth every 6 (six) hours. What changed:  medication strength how much to take when to take this reasons to take this   multivitamin-prenatal 27-0.8 MG Tabs tablet Take 1 tablet by mouth  daily at 12 noon.   norethindrone 0.35 MG tablet Commonly known as: Ortho Micronor Take 1 tablet (0.35 mg total) by mouth daily.         Discharge home in stable condition Infant Feeding: Breast Infant Disposition:home with mother Discharge instruction: per After Visit Summary and Postpartum booklet. Activity: Advance as tolerated. Pelvic rest for 6 weeks.  Diet: routine diet Future Appointments: Future Appointments  Date Time Provider [place]  09/28/2021  9:30 AM Leftwich-Kirby, Kathie Dike, CNM [facility]    Follow up Visit:  [facility]. Schedule an appointment as soon as possible for a visit in 6 week(s).   Specialty:  Obstetrics and Gynecology Why: postpartum visit Contact information: [place], [place] [phone]                Message sent to [facility] by Laury Deep, CNM on 08/31/21 and message re: BP check sent to [facility] on 09/01/21 by Dr. Cy Blamer Please schedule this patient for a In person postpartum visit in 4 weeks with the following provider: Any provider. Additional Postpartum F/U: 1 week BP check Low risk pregnancy complicated by:  Delivery mode:  Vaginal, Spontaneous  Anticipated Birth Control:  plan for 6 wk interval BTL  Renard Matter, MD, MPH OB Fellow, Faculty Practice

## 2021-08-31 NOTE — Progress Notes (Signed)
Catlin Doria is a 40 y.o. [id number] at [redacted]w[redacted]d by LMP admitted for ? SROM and contractions.  Subjective: Patient feeling more rectal pressure with contractions. Breathing with contractions now. Spouse supportive at bedside.  Objective: BP 140/72    Pulse 90    Temp 98 F (36.7 C) (Oral)    Resp 16    Ht 5\' 4"  (1.626 m)    Wt 91.6 kg    LMP 11/27/2020    SpO2 100%    BMI 34.67 kg/m  I/O last 3 completed shifts: In: -  Out: 700 [Urine:700] No intake/output data recorded.  FHT:  FHR: 125 bpm, variability: moderate,  accelerations:  Present,  decelerations:  Absent UC:   regular, every 3-4 minutes SVE:   Dilation: 10 Effacement (%): 100 Station: -1 Exam by:: [email or web address], CNM  Labs: Lab Results  Component Value Date   WBC 10.3 08/30/2021   HGB 12.2 08/30/2021   HCT 35.0 (L) 08/30/2021   MCV 87.7 08/30/2021   PLT 189 08/30/2021    Assessment / Plan: Spontaneous labor, progressing normally  Labor: Progressing on Pitocin Preeclampsia:   n/a Fetal Wellbeing:  Category I Pain Control:  Epidural I/D:  n/a Anticipated MOD:  NSVD  09/01/2021, CNM 08/31/2021, 9:58 AM

## 2021-08-31 NOTE — Lactation Note (Signed)
This note was copied from a baby's chart. Lactation Consultation Note  Patient Name: Nicole Pierce KGURK'Y Date: 08/31/2021 Reason for consult: Initial assessment;Term Age:39 hours  Mom has a history of low milk supply with her other babies and always needed to supplement.  Mom desires to exclusively breastfeed.    LC in to visit with P6 Mom of term baby.  Mom reports that baby has latched and fed well 3 times since birth.  Baby sleeping swaddled in crib right now.  LC set up DEBP and assisted Mom to pump for first time.  24 flanges appear to be a good fit currently.  Mom expressing colostrum already.  RN to obtain breast milk labels.  Mom encouraged to focus on latching baby to the breast and save EBM for when baby is hungry after a feeding and then ask for help with spoon feeding baby.   FOB taught how to disassemble pump parts, wash, rinse and air dry in separate bin provided.  Faxed WIC referral to [facility].    Plan- 1- Keep baby STS as much as possible (helps stimulate milk supply and encourage feedings) 2- Offer the breast with feeding cues, ask for help latching baby deeply, goal of 8-12 feedings per 24 hrs. 3-Pump both breasts on initiation setting, adding hand expression to stimulate milk supply 4- feed baby EBM by spoon  5-F/U with OP lactation after discharge   Lactation Tools Discussed/Used Tools: Pump;Flanges Flange Size: 24 Breast pump type: Double-Electric Breast Pump Pump Education: Setup, frequency, and cleaning;Milk Storage Reason for Pumping: support milk supply/history of low milk supply Pumping frequency: Encouraged to pump 15 mins after each breastfeeding  Discharge WIC Program: Yes  Consult Status Consult Status: Follow-up Date: 09/01/21 Follow-up type: In-patient    Nicole Pierce 08/31/2021, 4:47 PM

## 2021-08-31 NOTE — Progress Notes (Signed)
Patient continues to move monitors while moving in the bed. Continue to adjust and fetal heart rate is audible in room.

## 2021-08-31 NOTE — Anesthesia Procedure Notes (Signed)
Epidural Patient location during procedure: OB Start time: 08/31/2021 1:18 AM End time: 08/31/2021 1:36 AM  Staffing Anesthesiologist: Lowella Curb, MD Performed: anesthesiologist   Preanesthetic Checklist Completed: patient identified, IV checked, site marked, risks and benefits discussed, surgical consent, monitors and equipment checked, pre-op evaluation and timeout performed  Epidural Patient position: sitting Prep: ChloraPrep Patient monitoring: heart rate, cardiac monitor, continuous pulse ox and blood pressure Approach: midline Location: L2-L3 Injection technique: LOR saline  Needle:  Needle type: Tuohy  Needle gauge: 17 G Needle length: 9 cm Needle insertion depth: 5 cm Catheter type: closed end flexible Catheter size: 20 Guage Catheter at skin depth: 9 cm Test dose: negative  Assessment Events: blood not aspirated, injection not painful, no injection resistance, no paresthesia and negative IV test  Additional Notes Reason for block:procedure for pain

## 2021-08-31 NOTE — Plan of Care (Signed)
complete

## 2021-08-31 NOTE — Anesthesia Preprocedure Evaluation (Signed)
Anesthesia Evaluation  Patient identified by MRN, date of birth, ID band Patient awake    Reviewed: Allergy & Precautions, NPO status , Patient's Chart, lab work & pertinent test resultsPreop documentation limited or incomplete due to emergent nature of procedure.  Airway Mallampati: II  TM Distance: >3 FB Neck ROM: Full    Dental no notable dental hx.    Pulmonary neg pulmonary ROS,    Pulmonary exam normal breath sounds clear to auscultation       Cardiovascular negative cardio ROS Normal cardiovascular exam Rhythm:Regular Rate:Normal     Neuro/Psych negative neurological ROS  negative psych ROS   GI/Hepatic negative GI ROS, Neg liver ROS,   Endo/Other  negative endocrine ROS  Renal/GU negative Renal ROS  negative genitourinary   Musculoskeletal negative musculoskeletal ROS (+)   Abdominal   Peds negative pediatric ROS (+)  Hematology negative hematology ROS (+)   Anesthesia Other Findings   Reproductive/Obstetrics (+) Pregnancy                             Anesthesia Physical Anesthesia Plan  ASA: 2  Anesthesia Plan: Epidural   Post-op Pain Management:    Induction:   PONV Risk Score and Plan:   Airway Management Planned:   Additional Equipment:   Intra-op Plan:   Post-operative Plan:   Informed Consent:   Plan Discussed with:   Anesthesia Plan Comments:         Anesthesia Quick Evaluation

## 2021-09-01 ENCOUNTER — Telehealth: Payer: Self-pay | Admitting: *Deleted

## 2021-09-01 MED ORDER — ACETAMINOPHEN 325 MG PO TABS
650.0000 mg | ORAL_TABLET | ORAL | 0 refills | Status: AC | PRN
Start: 2021-09-01 — End: ?

## 2021-09-01 MED ORDER — IBUPROFEN 600 MG PO TABS: 600.0000 mg | ORAL_TABLET | Freq: Four times a day (QID) | ORAL | 0 refills | Status: AC

## 2021-09-01 MED ORDER — NORETHINDRONE 0.35 MG PO TABS: 1.0000 | ORAL_TABLET | Freq: Every day | ORAL | 1 refills | Status: AC

## 2021-09-01 NOTE — Telephone Encounter (Signed)
Left patient an urgent message to call the office to schedule 1 week BP check and 2-3 week GYN surgery appointment for BTL

## 2021-09-01 NOTE — Clinical Social Work Maternal (Signed)
°CLINICAL SOCIAL WORK MATERNAL/CHILD NOTE ° °Patient Details  °Name: Nicole Pierce °MRN: [id number] °Date of Birth: 08/02/1981 ° °Date:  09/01/2021 ° °Clinical Social Worker Initiating Note:  Canesha Tesfaye, LCSWA Date/Time: Initiated:  09/01/21/1030    ° °Child's Name:  Nicole Pierce  ° °Biological Parents:  Mother, Father (Hector Pierce 08/23/1970)  ° °Need for Interpreter:  None  ° °Reason for Referral:  Late or No Prenatal Care    ° °Address:  [address] °[address] °[place] [address]  °  °Phone number:  [phone] (home)    ° °Additional phone number:  ° °Household Members/Support Persons (HM/SP):   Household Member/Support Person 1, Household Member/Support Person 2, Household Member/Support Person 3 ° ° °HM/SP Name Relationship DOB or Age  °HM/SP -1 Hector Pierce Significant Other 08/23/1970  °HM/SP -2 Selena Pierce Daughter 10/28/2019  °HM/SP -3 Jeremiah Pierce Son 09/27/1997  °HM/SP -4        °HM/SP -5        °HM/SP -6        °HM/SP -7        °HM/SP -8        ° ° °Natural Supports (not living in the home):  Immediate Family, Extended Family  ° °Professional Supports: None  ° °Employment: Unemployed  ° °Type of Work:    ° °Education:  College graduate  ° °Homebound arranged:   ° °Financial Resources:  Medicaid  ° °Other Resources:  WIC  ° °Cultural/Religious Considerations Which May Impact Care:   ° °Strengths:  Ability to meet basic needs  , Pediatrician chosen, Home prepared for child    ° °Psychotropic Medications:        ° °Pediatrician:    [place] (including [place]) ° °Pediatrician List:  ° °[place]    °[place]    °[place]    °[place]    °[place]    °[place] [facility]  ° ° °Pediatrician Fax Number:   ° °Risk Factors/Current Problems:  None  ° °Cognitive State:  Insightful  , Linear Thinking  , Alert  , Goal Oriented    ° °Mood/Affect:  Interested  , Calm  , Relaxed  , Happy    ° °CSW Assessment: CSW  consulted for LPNC. CSW met with MOB to assess. CSW introduced self and role. CSW observed MOB holding infant and FOB bedside. CSW informed MOB of reason for consult and assessed current feelings. MOB reported she is currently doing well. MOB stated there was LPNC due to multiple family deaths and waiting to be approved for Medicaid. MOB denies any additional barriers. CSW informed MOB of the hospital drug screen policy. MOB aware a CPS report will be made if infant test positive for substance. MOB was understanding. ° °MOB denies any mental health history. MOB reported she has a strong support system in immediate and extended family. MOB denies any current SI or HI. MOB receives WIC resources.  ° °CSW provided education regarding the baby blues period versus PPD and provided resources. CSW provided the New Mom Checklist and encouraged MOB to self evaluate and contact a medical professional if symptoms are noted at any time.  ° °CSW provided review of Sudden Infant Death Syndrome (SIDS) precautions. MOB stated she has all infant needs. MOB denies any barriers to infant follow-up care. MOB reported no additional needs at this time.  ° °CSW will continue to follow UDS/CDS and make a CPS report if warranted. CSW identifies no further need   for intervention and no barriers to discharge at this time. ° °CSW Plan/Description:  CSW Will Continue to Monitor Umbilical Cord Tissue Drug Screen Results and Make Report if Warranted, Child Protective Service Report  , Hospital Drug Screen Policy Information, No Further Intervention Required/No Barriers to Discharge, Sudden Infant Death Syndrome (SIDS) Education, Perinatal Mood and Anxiety Disorder (PMADs) Education, Other Information/Referral to Community Resources  ° ° °Fitzgerald Dunne J Tila Millirons, LCSWA °09/01/2021, 11:16 AM °

## 2021-09-01 NOTE — Anesthesia Postprocedure Evaluation (Signed)
Anesthesia Post Note  Patient: Nicole Pierce  Procedure(s) Performed: AN AD HOC LABOR EPIDURAL     Patient location during evaluation: Mother Baby Anesthesia Type: Epidural Level of consciousness: awake, oriented and awake and alert Pain management: pain level controlled Vital Signs Assessment: post-procedure vital signs reviewed and stable Respiratory status: respiratory function stable, spontaneous breathing and nonlabored ventilation Cardiovascular status: stable Postop Assessment: no headache, adequate PO intake, able to ambulate, patient able to bend at knees and no apparent nausea or vomiting Anesthetic complications: no   No notable events documented.  Last Vitals:  Vitals:   09/01/21 0200 09/01/21 0600  BP: 126/76 119/87  Pulse: 80 71  Resp: 18 17  Temp: 36.7 C 36.8 C  SpO2: 98% 97%    Last Pain:  Vitals:   09/01/21 0600  TempSrc: Oral  PainSc: 3    Pain Goal:                   [name]

## 2021-09-01 NOTE — Telephone Encounter (Signed)
-----   Message from Warner Mccreedy, MD sent at 09/01/2021  9:50 AM EST ----- Regarding: Post partum appt Hi A couple of additional appts needed for this patient Additional Postpartum F/U: 1 week BP check and appt in 2-3 weeks with Gyn surgeon (Dr. Para March or anyone else) to discuss interval BTL Thanks! Dr. Ephriam Jenkins

## 2021-09-10 ENCOUNTER — Ambulatory Visit: Payer: Medicaid Other

## 2021-09-10 ENCOUNTER — Telehealth: Payer: Self-pay

## 2021-09-10 NOTE — Telephone Encounter (Signed)
Pt called to cancel BP check appt for today. Pt said she will call back to reschedule. I encouraged pt to take BP from home today and send [place] a message through MyChart. Pt was also encouraged to reschedule appt. Pt expressed understanding.

## 2021-09-14 ENCOUNTER — Telehealth (HOSPITAL_COMMUNITY): Payer: Self-pay | Admitting: *Deleted

## 2021-09-14 NOTE — Telephone Encounter (Signed)
Mom reports feeling good. No concerns about herself at this time. EPDS=2(No hospital score) Mom reports baby is doing well. Feeding, peeing, and pooping without difficulty. Safe sleep reviewed. Mom reports no concerns about baby at present.  Duffy Rhody, RN 09-14-2021 at 11:47am

## 2021-09-20 ENCOUNTER — Telehealth: Payer: Self-pay | Admitting: *Deleted

## 2021-09-20 ENCOUNTER — Encounter: Payer: Self-pay | Admitting: Advanced Practice Midwife

## 2021-09-20 NOTE — Telephone Encounter (Signed)
Pt sent in her BP 117/90.  She was to send it in 10 days ago.  Her next OB appt is Thursday 09/23/21.

## 2021-09-23 ENCOUNTER — Ambulatory Visit: Payer: Medicaid Other | Admitting: Obstetrics & Gynecology

## 2021-09-28 ENCOUNTER — Ambulatory Visit: Payer: Medicaid Other | Admitting: Advanced Practice Midwife

## 2021-10-14 ENCOUNTER — Ambulatory Visit: Payer: Medicaid Other | Admitting: Obstetrics and Gynecology

## 2022-10-22 IMAGING — US US MFM OB DETAIL+14 WK
1 series · 13 of 28 positions shown · non-contrast
Comparison: none

[Series 1: us mfm ob detail+14 wk · 99 acquisitions, 13 frames shown]
[im 4/99]
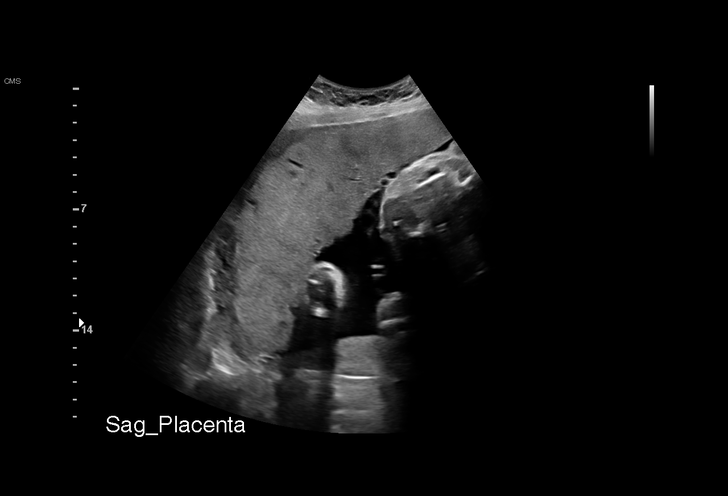
[im 11/99]
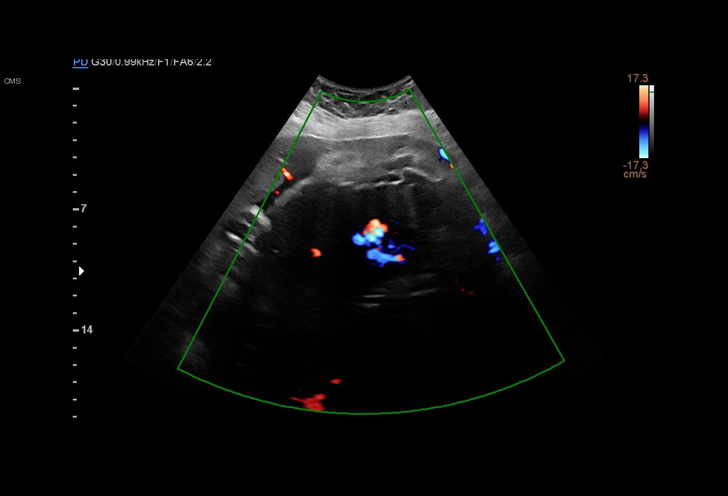
[im 19/99]
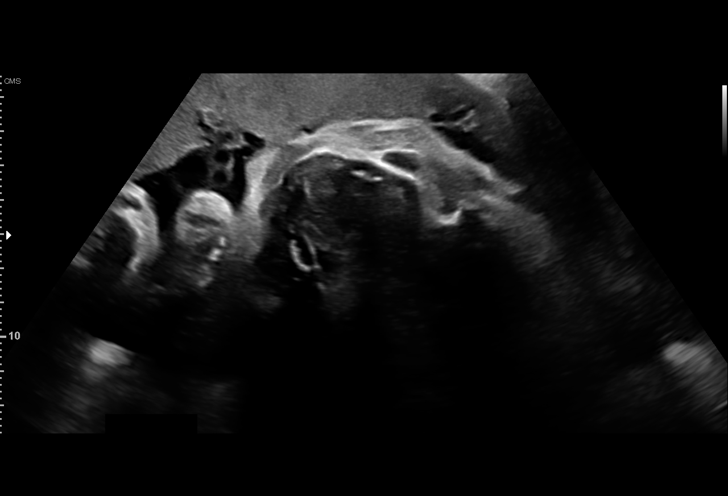
[im 26/99]
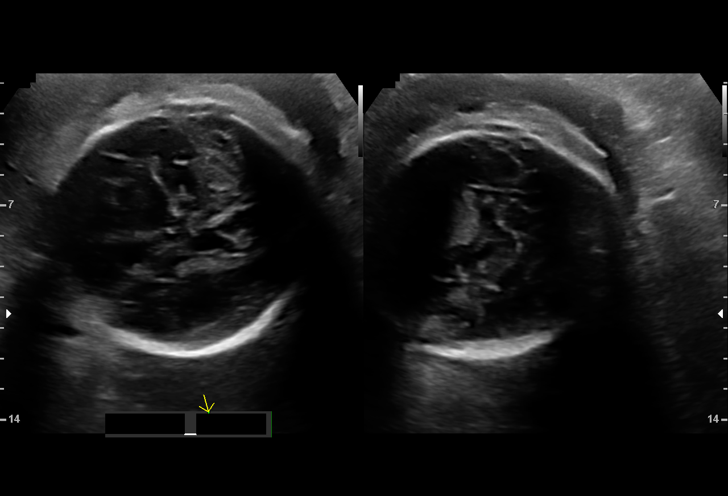
[im 33/99]
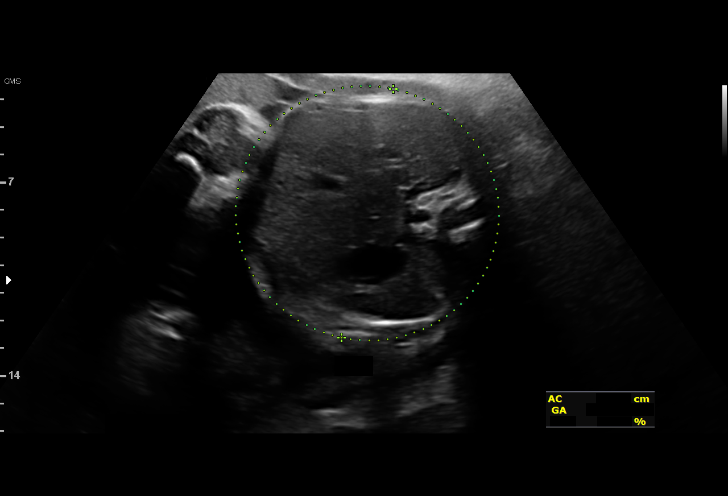
[im 40/99]
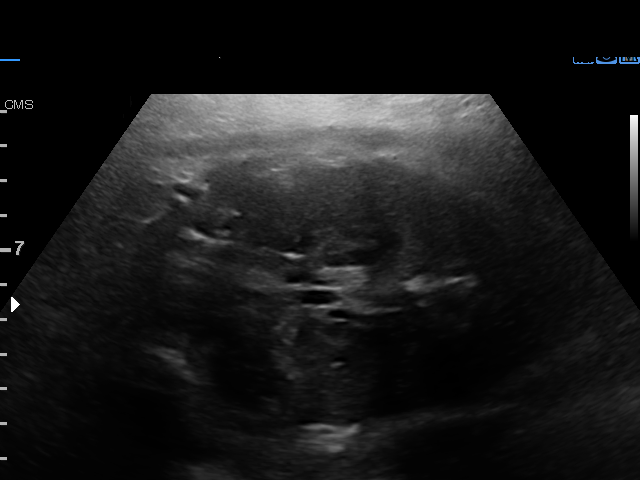
[im 51/99]
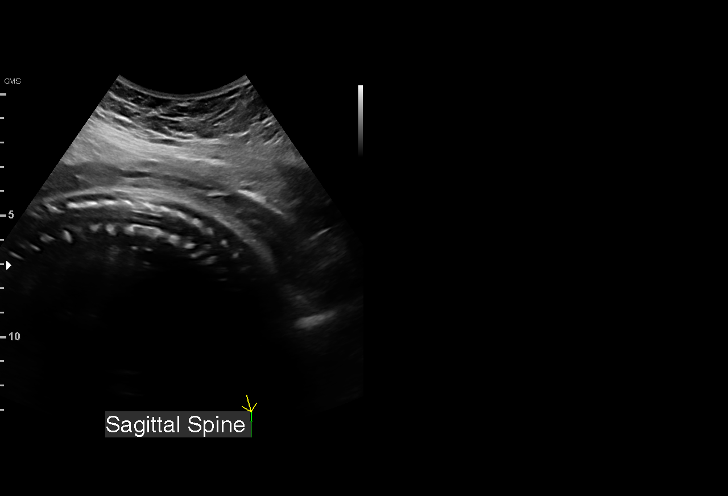
[im 59/99]
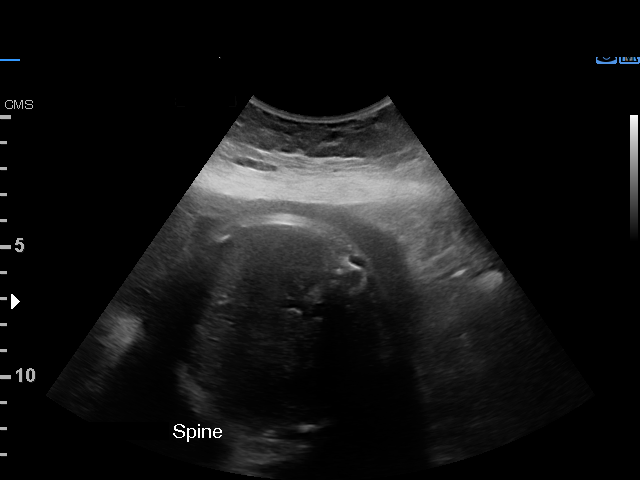
[im 66/99]
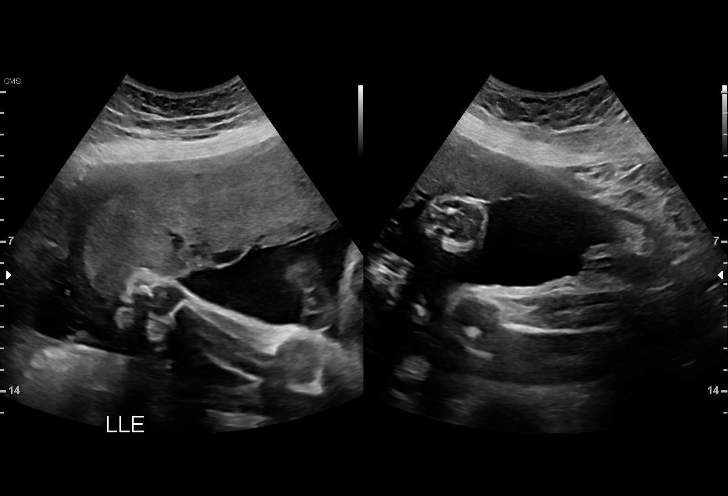
[im 73/99]
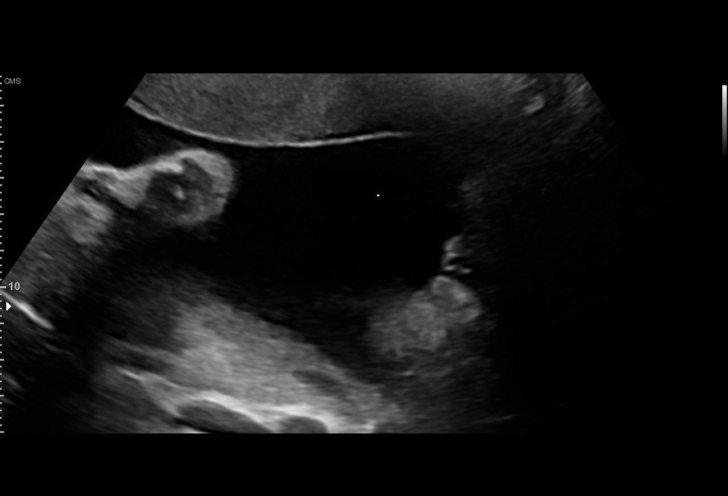
[im 80/99]
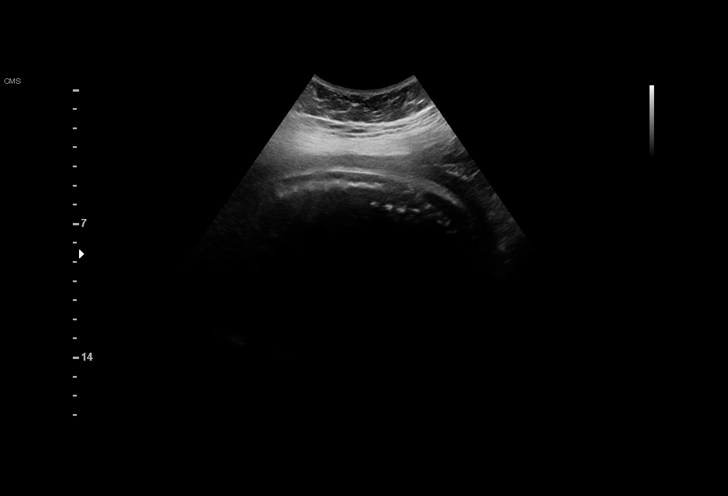
[im 88/99]
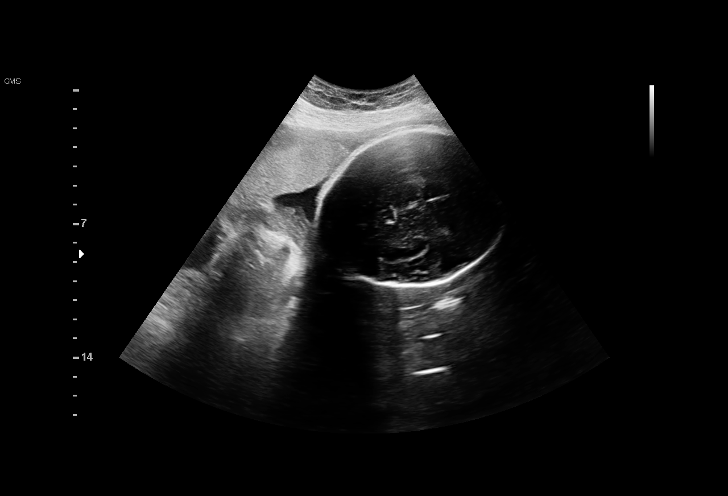
[im 95/99]
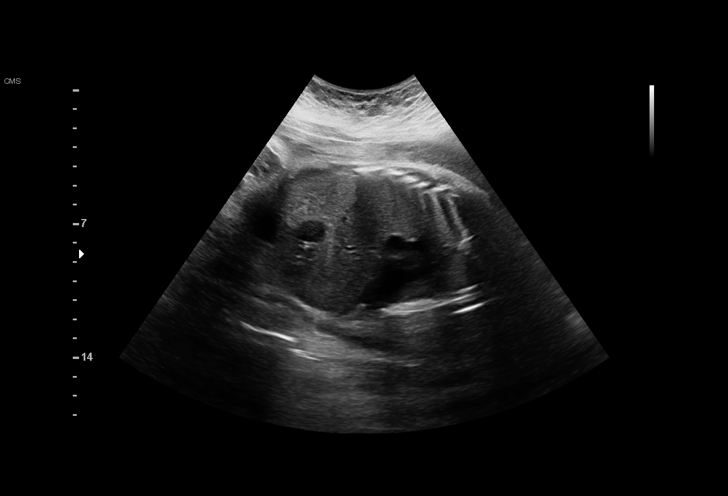

[13 of 28 positions shown; findings below may reference images not displayed]

Indications

 32 weeks gestation of pregnancy
 Antenatal screening for malformations
 Late to prenatal care, third trimester
 (initiated 07/06/21)
 Advanced maternal age multigravida 35+,
 third trimester
Fetal Evaluation

 Num Of Fetuses:         1
 Fetal Heart Rate(bpm):  155
 Cardiac Activity:       Observed
 Presentation:           Cephalic
 Placenta:               Anterior
 P. Cord Insertion:      Not well visualized

 Amniotic Fluid
 AFI FV:      Within normal limits

 AFI Sum(cm)     %Tile       Largest Pocket(cm)
 9.9             15

 RUQ(cm)                     LUQ(cm)        LLQ(cm)

Biometry

 BPD:      80.8  mm     G. Age:  32w 3d         55  %    CI:        73.78   %    70 - 86
                                                         FL/HC:      20.5   %    19.1 -
 HC:      298.8  mm     G. Age:  33w 1d         42  %    HC/AC:      1.01        0.96 -
 AC:      294.9  mm     G. Age:  33w 3d         87  %    FL/BPD:     76.0   %    71 - 87
 FL:       61.4  mm     G. Age:  31w 6d         33  %    FL/AC:      20.8   %    20 - 24
 HUM:        51  mm     G. Age:  29w 6d          5  %

 LV:        4.1  mm

 Est. FW:    2838  gm      4 lb 9 oz     68  %
OB History

 Gravidity:    6         Term:   5        Prem:   0        SAB:   0
 TOP:          0       Ectopic:  0        Living: 5
Gestational Age

 LMP:           32w 0d        Date:  11/27/20                 EDD:   09/03/21
 U/S Today:     32w 5d                                        EDD:   08/29/21
 Best:          32w 0d     Det. By:  LMP  (11/27/20)          EDD:   09/03/21
Anatomy

 Cranium:               Appears normal         LVOT:                   Appears normal
 Cavum:                 Appears normal         Aortic Arch:            Appears normal
 Ventricles:            Appears normal         Ductal Arch:            Not well visualized
 Choroid Plexus:        Appears normal         Diaphragm:              Appears normal
 Cerebellum:            Appears normal         Stomach:                Appears normal, left
                                                                       sided
 Posterior Fossa:       Appears normal         Abdomen:                Appears normal
 Nuchal Fold:           Not applicable (>20    Abdominal Wall:         Not well visualized
                        wks GA)
 Face:                  Appears normal         Cord Vessels:           Appears normal (3
                        (orbits and profile)                           vessel cord)
 Lips:                  Appears normal         Kidneys:                Appear normal
 Palate:                Not well visualized    Bladder:                Appears normal
 Thoracic:              Appears normal         Spine:                  Limited views
                                                                       appear normal
 Heart:                 Not well visualized    Upper Extremities:      Visualized
 RVOT:                  Appears normal         Lower Extremities:      Appears normal
Targeted Anatomy

 Head/Neck
 Nasal Bone:            Present                Maxilla:                Appears normal
 Mandible:              Appears normal

 Thorax
 SVC:                   Appears normal         3 V Trachea View:       Appears normal
 3 Vessel View:         Appears normal         IVC:                    Appears normal

 Other
 Genitalia:             Normal Female
Cervix Uterus Adnexa

 Cervix
 Not visualized (advanced GA >19wks)

 Uterus
 No abnormality visualized.
 Right Ovary
 Not visualized.

 Left Ovary
 Not visualized.

 Cul De Sac
 No free fluid seen.

 Adnexa
 No abnormality visualized.
Impression

 Single intrauterine pregnancy here for a detailed anatomy
 due to elevated BMI and late to prenatal care.
 Normal anatomy with measurements consistent with dates
 There is good fetal movement and amniotic fluid volume
 Suboptimal views of the fetal anatomy were obtained
 secondary to fetal position and advanced gestational age.

 I discussed today's visit and the limitation of today's exam
 due to advanced gestational age.
Recommendations

 Follow up growth in 3-4 weeks.

## 2022-11-18 IMAGING — US US MFM OB FOLLOW-UP
1 series · 13 of 28 positions shown · non-contrast
Comparison: none

[Series 1: us mfm ob follow-up · 60 acquisitions, 13 frames shown]
[im 3/60]
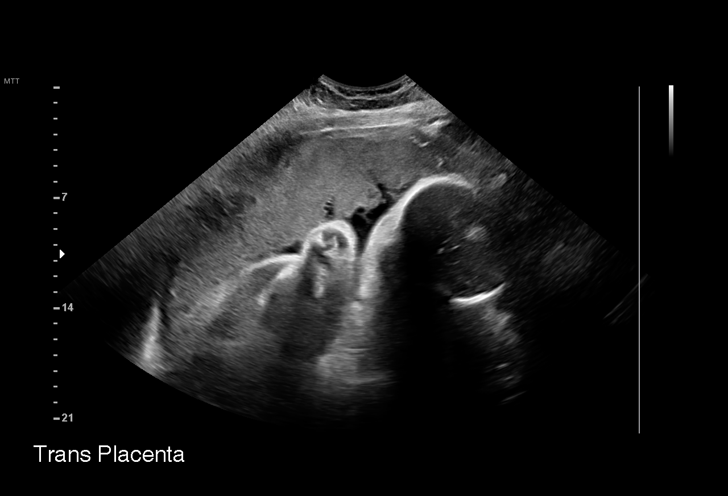
[im 7/60]
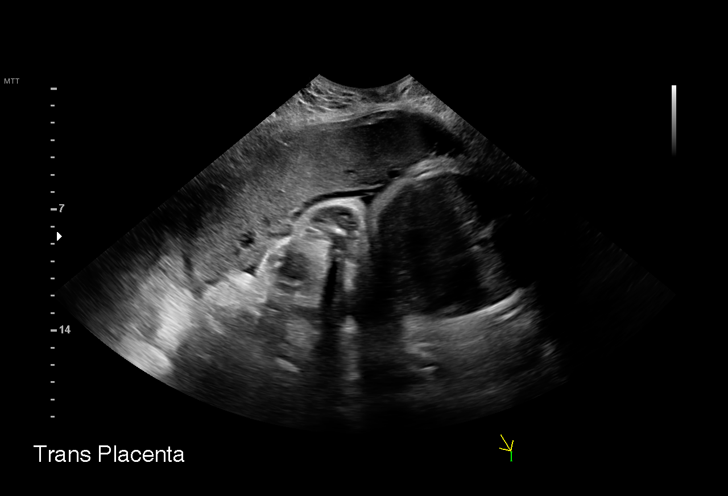
[im 11/60]
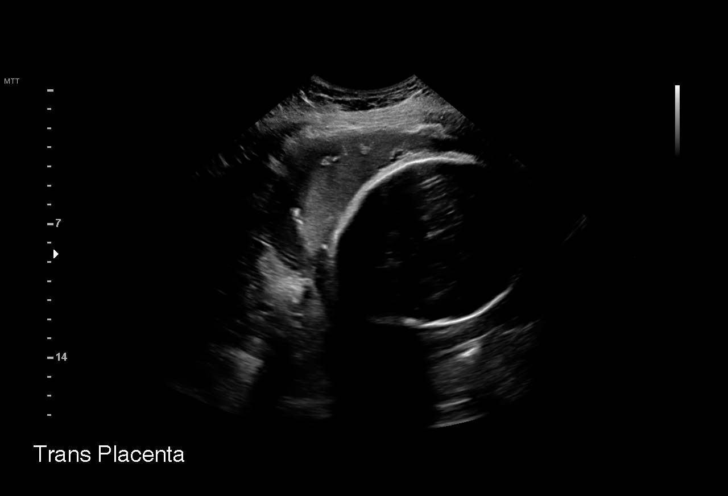
[im 16/60]
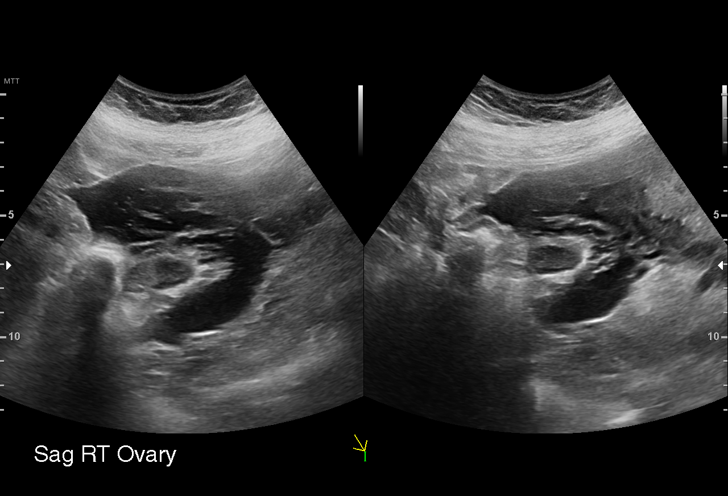
[im 20/60]
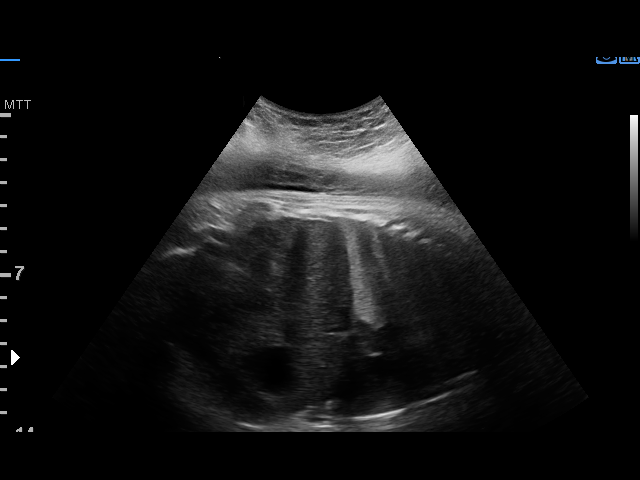
[im 25/60]
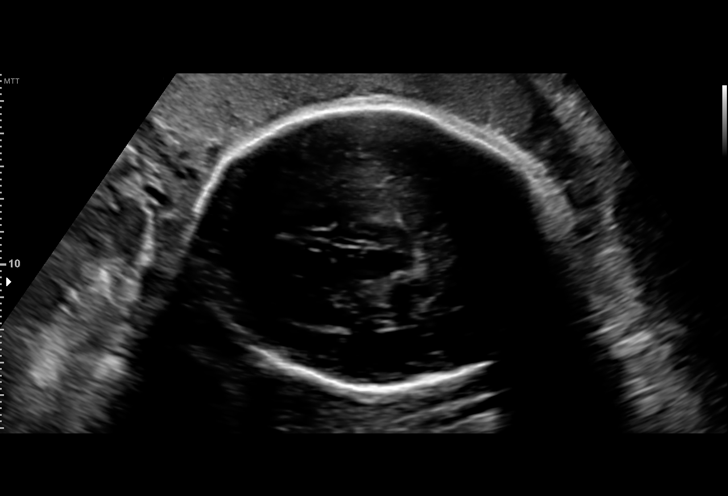
[im 31/60]
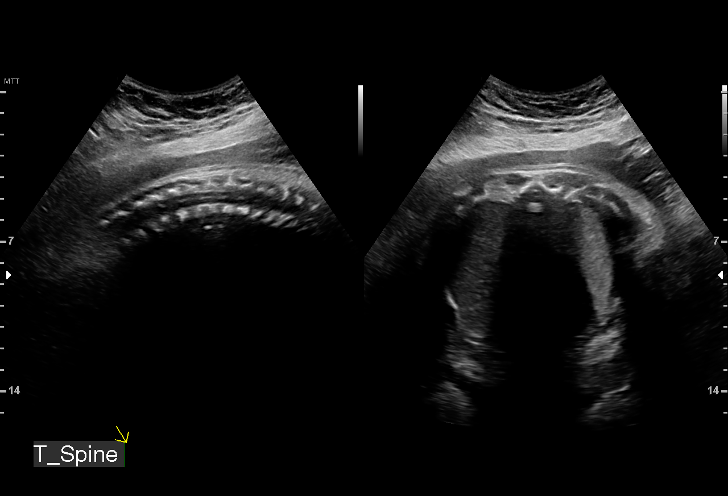
[im 35/60]
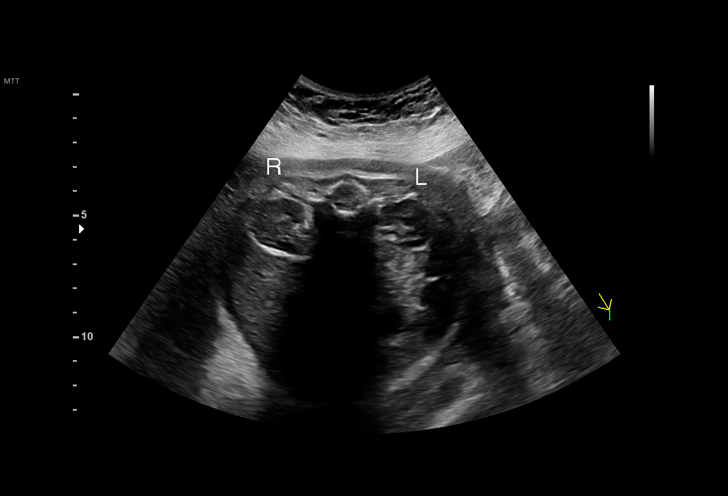
[im 40/60]
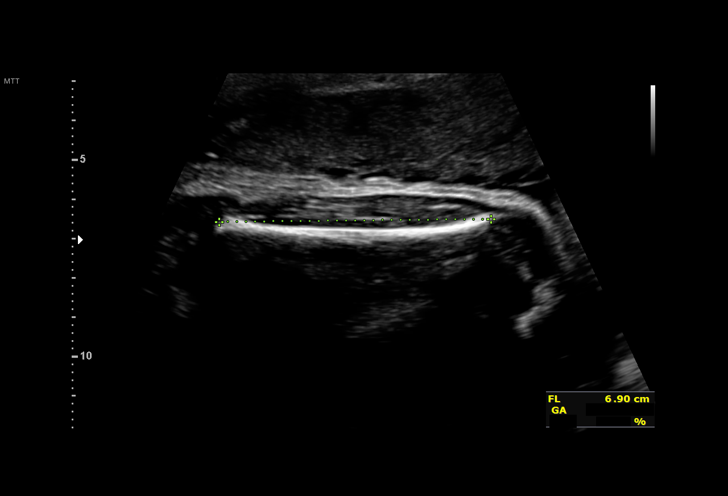
[im 44/60]
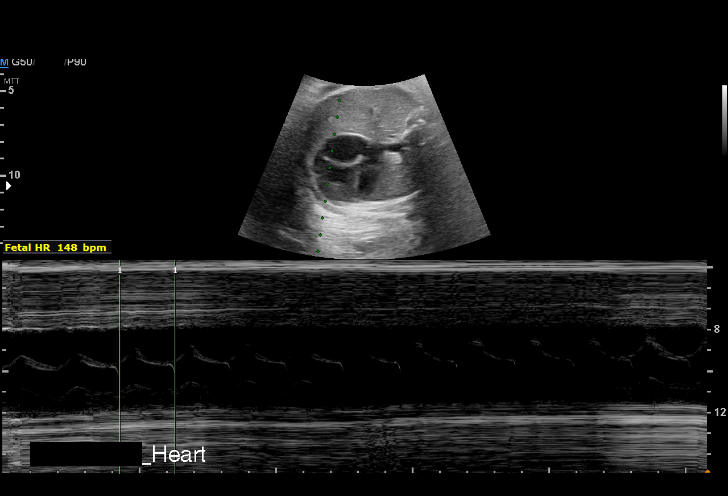
[im 49/60]
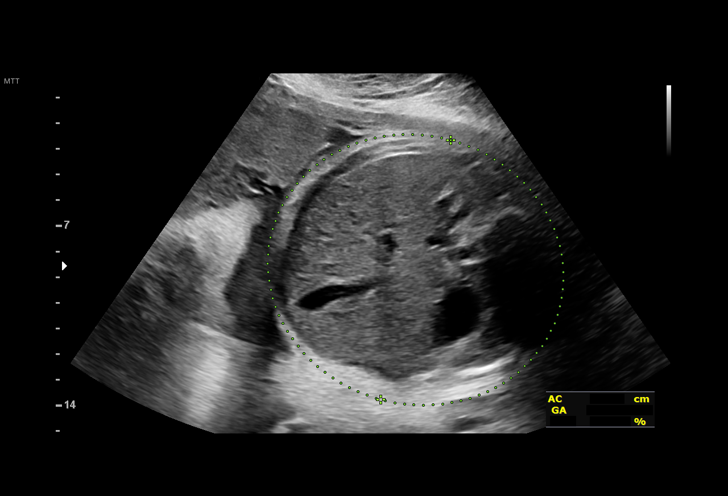
[im 53/60]
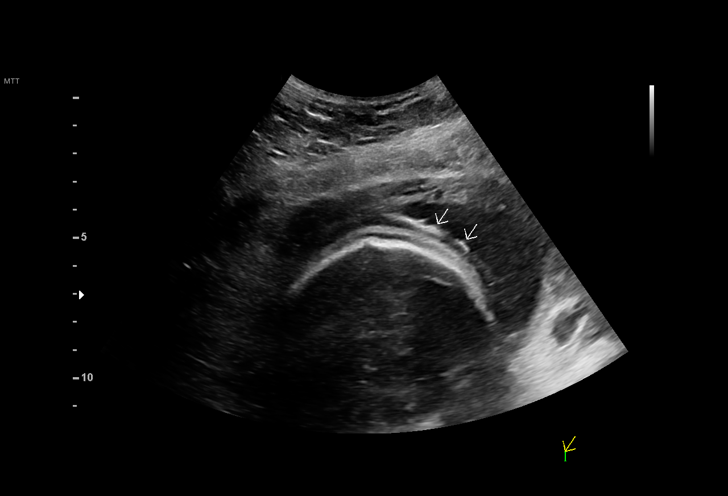
[im 57/60]
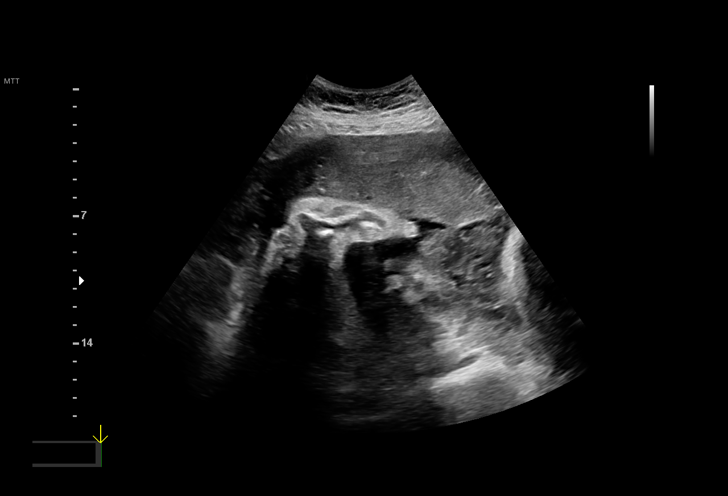

[13 of 28 positions shown; findings below may reference images not displayed]

DANII

Indications

 Late to prenatal care, third trimester
 (initiated 07/06/21)
 Advanced maternal age multigravida 35+,
 third trimester
 35 weeks gestation of pregnancy
 Encounter for other antenatal screening
 follow-up
 Short interval between pregancies, 3rd
 trimester
 Grand multiparity, antepartum
 LR NIPS
 Obesity complicating pregnancy, third
 trimester
Fetal Evaluation

 Num Of Fetuses:         1
 Fetal Heart Rate(bpm):  148
 Cardiac Activity:       Observed
 Presentation:           Cephalic
 Placenta:               Anterior
 P. Cord Insertion:      Not well visualized

 Amniotic Fluid
 AFI FV:      Within normal limits

 AFI Sum(cm)     %Tile       Largest Pocket(cm)
 14.1            51          5

 RUQ(cm)       RLQ(cm)       LUQ(cm)        LLQ(cm)
 5
Biometry

 BPD:        87  mm     G. Age:  35w 1d         36  %    CI:        75.38   %    70 - 86
                                                         FL/HC:      21.6   %    20.1 -
 HC:      317.8  mm     G. Age:  35w 5d         18  %    HC/AC:      0.92        0.93 -
 AC:      346.6  mm     G. Age:  38w 4d         99  %    FL/BPD:     79.0   %    71 - 87
 FL:       68.7  mm     G. Age:  35w 2d         30  %    FL/AC:      19.8   %    20 - 24

 LV:        7.1  mm

 Est. FW:    7331  gm    6 lb 14 oz      82  %
OB History

 Gravidity:    6         Term:   5        Prem:   0        SAB:   0
 TOP:          0       Ectopic:  0        Living: 5
Gestational Age

 LMP:           35w 6d        Date:  11/27/20                 EDD:   09/03/21
 U/S Today:     36w 1d                                        EDD:   09/01/21
 Best:          35w 6d     Det. By:  LMP  (11/27/20)          EDD:   09/03/21
Anatomy

 Cranium:               Appears normal         LVOT:                   Previously seen
 Cavum:                 Appears normal         Aortic Arch:            Previously seen
 Ventricles:            Appears normal         Ductal Arch:            Not well visualized
 Choroid Plexus:        Previously seen        Diaphragm:              Appears normal
 Cerebellum:            Previously seen        Stomach:                Appears normal, left
                                                                       sided
 Posterior Fossa:       Previously seen        Abdomen:                Previously seen
 Nuchal Fold:           Not applicable (>20    Abdominal Wall:         Not well visualized
                        wks GA)
 Face:                  Orbits and profile     Cord Vessels:           Previously seen
                        previously seen
 Lips:                  Previously seen        Kidneys:                Appear normal
 Palate:                Not well visualized    Bladder:                Appears normal
 Thoracic:              Appears normal         Spine:                  Limited views
                                                                       appear normal
 Heart:                 Not well visualized    Upper Extremities:      Visualized
                                                                       previously
 RVOT:                  Previously seen        Lower Extremities:      Previously seen

 Other:  Female gender previously seen. Nasal bone, Mandible, Maxilla, VC,
         3VV and 3VTV visualized previously. Technically difficult due to
         advanced GA and fetal position.
Cervix Uterus Adnexa

 Cervix
 Not visualized (advanced GA >68wks)

 Uterus
 No abnormality visualized.

 Right Ovary
 Within normal limits.
 Left Ovary
 Within normal limits.

 Cul De Sac
 No free fluid seen.

 Adnexa
 No abnormality visualized.
Comments

 This patient was seen for a follow up growth scan due to
 advanced maternal age and grand multiparity.  She denies
 any problems since her last exam and has screened negative
 for gestational diabetes.
 She was informed that the fetal growth and amniotic fluid
 level appears appropriate for her gestational age.
 Fetal movements and fetal breathing movements were noted
 throughout today's exam.
 Due to advanced maternal age and grand multiparity, delivery
 may be considered at any time at 39 weeks or greater.
